# Patient Record
Sex: Male | Born: 2000 | Hispanic: Yes | Marital: Single | Smoking: Never smoker
Health system: Southern US, Community
[De-identification: ages and names within clinical notes are randomized; demographics above are authoritative.]

---
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
## 2021-04-03 ENCOUNTER — Emergency Department
Admission: EM | Admit: 2021-04-03 | Discharge: 2021-04-03 | Disposition: A | Discharge status: Home / Self Care | Payer: Medicaid Other | Attending: Emergency Medicine | Admitting: Emergency Medicine

## 2021-04-03 ENCOUNTER — Other Ambulatory Visit: Payer: Self-pay

## 2021-04-03 DIAGNOSIS — H5789 Other specified disorders of eye and adnexa: Secondary | ICD-10-CM | POA: Diagnosis present

## 2021-04-03 DIAGNOSIS — H1013 Acute atopic conjunctivitis, bilateral: Secondary | ICD-10-CM | POA: Diagnosis not present

## 2021-04-03 MED ORDER — CETIRIZINE HCL 10 MG PO TABS: 10.0000 mg | ORAL_TABLET | Freq: Every day | ORAL | 0 refills | Status: AC

## 2021-04-03 MED ORDER — LORATADINE 10 MG PO TABS
10.0000 mg | ORAL_TABLET | Freq: Every day | ORAL | Status: DC
  Administered 2021-04-03: 10 mg via ORAL
  Filled 2021-04-03: qty 1

## 2021-04-03 MED ORDER — FAMOTIDINE 20 MG PO TABS
20.0000 mg | ORAL_TABLET | Freq: Once | ORAL | Status: AC
  Administered 2021-04-03: 20 mg via ORAL
  Filled 2021-04-03: qty 1

## 2021-04-03 MED ORDER — FAMOTIDINE 20 MG PO TABS: 20.0000 mg | ORAL_TABLET | Freq: Two times a day (BID) | ORAL | 0 refills | Status: AC

## 2021-04-03 NOTE — Discharge Instructions (Addendum)
Please use the allergy medications as prescribed.  Please return to the emergency department if you develop any worsening of swelling, redness, fever or other significant changes.

## 2021-04-03 NOTE — ED Notes (Signed)
See triage note  Presents with both eyes swollen and slightly irritated

## 2021-04-03 NOTE — ED Triage Notes (Signed)
Pt comes with bilateral eye swelling. Pt states he woke up this am and noticed it.  Pt denies any new foods or detergents.

## 2021-04-03 NOTE — ED Provider Notes (Signed)
[facility] Emergency Department Provider Note  ____________________________________________   Event Date/Time   First MD Initiated Contact with Patient 04/03/21 1136     (approximate)  I have reviewed the triage vital signs and the nursing notes.   HISTORY  Chief Complaint Facial Swelling  HPI Mike Jordan is a 20 y.o. male who presents to the emergency department for evaluation of bilateral eye swelling and irritation.  Patient states that he woke up this morning with both eyes puffy and swollen on the eyelid.  He states that he thinks he may have gotten bitten on the right eyelid during the night, but is unsure what he may have gotten bitten by.  He is also unsure why this may have caused swelling of the left eyelid.  He reports that his current eye swelling is much improved compared to onset this morning.  He does report a known history of allergic rhinitis, states he has never had swelling of the eyes.  He states he is not on any allergy medications.  He denies being in contact with any new foods, detergents or else.  He denies any cough, shortness of breath, chest pain, abdominal pain, nausea vomiting or diarrhea.  He denies any rash or urticaria.        History reviewed. No pertinent past medical history.  There are no problems to display for this patient.   History reviewed. No pertinent surgical history.  Prior to Admission medications   Medication Sig Start Date End Date Taking? Authorizing Provider  cetirizine (ZYRTEC ALLERGY) 10 MG tablet Take 1 tablet (10 mg total) by mouth daily. 04/03/21 05/03/21 Yes Fusae Florio, Ruben Gottron, PA  famotidine (PEPCID) 20 MG tablet Take 1 tablet (20 mg total) by mouth 2 (two) times daily. 04/03/21 05/03/21 Yes Lucy Chris, PA    Allergies Patient has no known allergies.  No family history on file.  Social History Social History   Tobacco Use  . Smoking status: Never Smoker  . Smokeless tobacco: Never  Used  Substance Use Topics  . Alcohol use: Yes    Comment: occasionally  . Drug use: Never    Review of Systems Constitutional: No fever/chills Eyes: + Eye swelling, no visual changes. ENT: No sore throat. Cardiovascular: Denies chest pain. Respiratory: Denies shortness of breath. Gastrointestinal: No abdominal pain.  No nausea, no vomiting.  No diarrhea.  No constipation. Genitourinary: Negative for dysuria. Musculoskeletal: Negative for back pain. Skin: Negative for rash. Neurological: Negative for headaches, focal weakness or numbness.  ____________________________________________   PHYSICAL EXAM:  VITAL SIGNS: ED Triage Vitals  Enc Vitals Group     BP 04/03/21 1110 123/75     Pulse Rate 04/03/21 1110 62     Resp 04/03/21 1110 18     Temp 04/03/21 1110 97.8 F (36.6 C)     Temp Source 04/03/21 1110 Oral     SpO2 04/03/21 1110 98 %     Weight 04/03/21 1112 190 lb (86.2 kg)     Height 04/03/21 1112 5\' 10"  (1.778 m)     Head Circumference --      Peak Flow --      Pain Score 04/03/21 1112 0     Pain Loc --      Pain Edu? --      Excl. in GC? --    Constitutional: Alert and oriented. Well appearing and in no acute distress. Eyes: There is no erythema on the eyelids or periorbital region.  There is minimal soft tissue swelling of the bilateral eyelids, slightly worse on right than left.  No identifiable puncture marks or insect bites of the left eye.  Conjunctivae are normal. PERRL. EOMI. Head: Atraumatic. Nose: Mild congestion/rhinnorhea. Mouth/Throat: Mucous membranes are moist.  Neck: No stridor.   Lymphatic: No cervical lymphadenopathy Cardiovascular: Normal rate, regular rhythm. Grossly normal heart sounds.  Good peripheral circulation. Respiratory: Normal respiratory effort.  No retractions. Lungs CTAB. Gastrointestinal: Soft and nontender. No distention. No abdominal bruits. No CVA tenderness. Musculoskeletal: No lower extremity tenderness nor edema.  No  joint effusions. Neurologic:  Normal speech and language. No gross focal neurologic deficits are appreciated. No gait instability. Skin:  Skin is warm, dry and intact. No rash noted. Psychiatric: Mood and affect are normal. Speech and behavior are normal.  ____________________________________________   INITIAL IMPRESSION / ASSESSMENT AND PLAN / ED COURSE  As part of my medical decision making, I reviewed the following data within the electronic MEDICAL RECORD NUMBER Nursing notes reviewed and incorporated and Notes from prior ED visits        Patient is an 20 year old male who presents to the emergency department for evaluation of bilateral eye puffiness that is reportedly improved now compared to when the patient woke up.  He has not had any fevers, and vitals are normal when presenting to the emergency department today.  On exam, he does have some very mild soft tissue swelling of the eyelids only, there is no periorbital swelling or tenderness or erythema.  Remainder physical exam is grossly normal.  Suspect that the patient symptoms are likely related to seasonal allergies.  We will start the patient on oral cetirizine and short course of Pepcid to treat symptoms.  Return precautions were discussed at length, including fever, changes in vision, increasing swelling despite medication therapy.  Patient is amenable with this plan and is stable this time for outpatient follow-up.      ____________________________________________   FINAL CLINICAL IMPRESSION(S) / ED DIAGNOSES  Final diagnoses:  Allergic conjunctivitis of both eyes     ED Discharge Orders         Ordered    cetirizine (ZYRTEC ALLERGY) 10 MG tablet  Daily        04/03/21 1157    famotidine (PEPCID) 20 MG tablet  2 times daily        04/03/21 1157          *Please note:  Mike Jordan was evaluated in Emergency Department on 04/03/2021 for the symptoms described in the history of present illness. He was evaluated  in the context of the global COVID-19 pandemic, which necessitated consideration that the patient might be at risk for infection with the SARS-CoV-2 virus that causes COVID-19. Institutional protocols and algorithms that pertain to the evaluation of patients at risk for COVID-19 are in a state of rapid change based on information released by regulatory bodies including the CDC and federal and state organizations. These policies and algorithms were followed during the patient's care in the ED.  Some ED evaluations and interventions may be delayed as a result of limited staffing during and the pandemic.*   Note:  This document was prepared using Dragon voice recognition software and may include unintentional dictation errors.   Lucy Chris, PA 04/03/21 1943    Jene Every, MD 04/04/21 [phone]
